# Patient Record
Sex: Male | Born: 1945 | Race: White | Hispanic: No | Marital: Married | Smoking: Former smoker
Health system: Southern US, Community
[De-identification: ages and names within clinical notes are randomized; demographics above are authoritative.]

## PROBLEM LIST (undated history)

## (undated) DIAGNOSIS — K219 Gastro-esophageal reflux disease without esophagitis: Secondary | ICD-10-CM

## (undated) DIAGNOSIS — E119 Type 2 diabetes mellitus without complications: Secondary | ICD-10-CM

## (undated) DIAGNOSIS — J320 Chronic maxillary sinusitis: Secondary | ICD-10-CM

## (undated) DIAGNOSIS — I10 Essential (primary) hypertension: Secondary | ICD-10-CM

## (undated) DIAGNOSIS — E039 Hypothyroidism, unspecified: Secondary | ICD-10-CM

## (undated) HISTORY — PX: JOINT REPLACEMENT: SHX530

---

## 2008-04-09 DIAGNOSIS — C801 Malignant (primary) neoplasm, unspecified: Secondary | ICD-10-CM

## 2008-04-09 HISTORY — PX: LARYNX SURGERY: SHX692

## 2008-04-09 HISTORY — DX: Malignant (primary) neoplasm, unspecified: C80.1

## 2014-12-16 ENCOUNTER — Ambulatory Visit (INDEPENDENT_AMBULATORY_CARE_PROVIDER_SITE_OTHER): Payer: Medicare Other | Admitting: Otolaryngology

## 2014-12-16 DIAGNOSIS — R490 Dysphonia: Secondary | ICD-10-CM | POA: Diagnosis not present

## 2015-06-23 ENCOUNTER — Ambulatory Visit (INDEPENDENT_AMBULATORY_CARE_PROVIDER_SITE_OTHER): Payer: Medicare Other | Admitting: Otolaryngology

## 2015-06-23 DIAGNOSIS — Z8521 Personal history of malignant neoplasm of larynx: Secondary | ICD-10-CM | POA: Diagnosis not present

## 2016-06-21 ENCOUNTER — Ambulatory Visit (INDEPENDENT_AMBULATORY_CARE_PROVIDER_SITE_OTHER): Payer: Medicare Other | Admitting: Otolaryngology

## 2016-06-21 DIAGNOSIS — Z8521 Personal history of malignant neoplasm of larynx: Secondary | ICD-10-CM

## 2016-08-13 ENCOUNTER — Ambulatory Visit (INDEPENDENT_AMBULATORY_CARE_PROVIDER_SITE_OTHER): Payer: Medicare Other | Admitting: Otolaryngology

## 2016-08-13 DIAGNOSIS — H6983 Other specified disorders of Eustachian tube, bilateral: Secondary | ICD-10-CM

## 2016-08-13 DIAGNOSIS — H9313 Tinnitus, bilateral: Secondary | ICD-10-CM

## 2016-08-13 DIAGNOSIS — H903 Sensorineural hearing loss, bilateral: Secondary | ICD-10-CM | POA: Diagnosis not present

## 2017-07-11 ENCOUNTER — Ambulatory Visit (INDEPENDENT_AMBULATORY_CARE_PROVIDER_SITE_OTHER): Payer: Medicare Other | Admitting: Otolaryngology

## 2017-07-11 DIAGNOSIS — Z8521 Personal history of malignant neoplasm of larynx: Secondary | ICD-10-CM

## 2018-04-21 ENCOUNTER — Ambulatory Visit (INDEPENDENT_AMBULATORY_CARE_PROVIDER_SITE_OTHER): Payer: Medicare Other | Admitting: Otolaryngology

## 2018-04-21 DIAGNOSIS — J0101 Acute recurrent maxillary sinusitis: Secondary | ICD-10-CM | POA: Diagnosis not present

## 2018-04-21 DIAGNOSIS — J310 Chronic rhinitis: Secondary | ICD-10-CM

## 2018-05-12 ENCOUNTER — Ambulatory Visit (INDEPENDENT_AMBULATORY_CARE_PROVIDER_SITE_OTHER): Payer: Medicare Other | Admitting: Otolaryngology

## 2018-05-12 DIAGNOSIS — J320 Chronic maxillary sinusitis: Secondary | ICD-10-CM

## 2018-05-12 DIAGNOSIS — J310 Chronic rhinitis: Secondary | ICD-10-CM

## 2018-05-13 ENCOUNTER — Other Ambulatory Visit (HOSPITAL_COMMUNITY): Payer: Self-pay | Admitting: Otolaryngology

## 2018-05-13 ENCOUNTER — Other Ambulatory Visit: Payer: Self-pay | Admitting: Otolaryngology

## 2018-05-13 DIAGNOSIS — J320 Chronic maxillary sinusitis: Secondary | ICD-10-CM

## 2018-05-28 ENCOUNTER — Ambulatory Visit (HOSPITAL_COMMUNITY)
Admission: RE | Admit: 2018-05-28 | Discharge: 2018-05-28 | Disposition: A | Discharge status: Home / Self Care | Payer: Medicare Other | Source: Ambulatory Visit | Attending: Otolaryngology | Admitting: Otolaryngology

## 2018-05-28 DIAGNOSIS — J320 Chronic maxillary sinusitis: Secondary | ICD-10-CM

## 2018-06-09 ENCOUNTER — Ambulatory Visit (INDEPENDENT_AMBULATORY_CARE_PROVIDER_SITE_OTHER): Payer: Medicare Other | Admitting: Otolaryngology

## 2018-06-09 DIAGNOSIS — R51 Headache: Secondary | ICD-10-CM | POA: Diagnosis not present

## 2018-06-09 DIAGNOSIS — J320 Chronic maxillary sinusitis: Secondary | ICD-10-CM | POA: Diagnosis not present

## 2019-03-12 ENCOUNTER — Ambulatory Visit (INDEPENDENT_AMBULATORY_CARE_PROVIDER_SITE_OTHER): Payer: Medicare Other | Admitting: Otolaryngology

## 2019-03-31 ENCOUNTER — Other Ambulatory Visit: Payer: Self-pay | Admitting: Otolaryngology

## 2019-03-31 ENCOUNTER — Other Ambulatory Visit (HOSPITAL_COMMUNITY): Payer: Self-pay | Admitting: Otolaryngology

## 2019-03-31 DIAGNOSIS — J320 Chronic maxillary sinusitis: Secondary | ICD-10-CM

## 2019-04-08 ENCOUNTER — Ambulatory Visit (HOSPITAL_COMMUNITY)
Admission: RE | Admit: 2019-04-08 | Discharge: 2019-04-08 | Disposition: A | Discharge status: Home / Self Care | Payer: Medicare Other | Source: Ambulatory Visit | Attending: Otolaryngology | Admitting: Otolaryngology

## 2019-04-08 ENCOUNTER — Other Ambulatory Visit: Payer: Self-pay

## 2019-04-08 DIAGNOSIS — J320 Chronic maxillary sinusitis: Secondary | ICD-10-CM | POA: Insufficient documentation

## 2019-05-01 ENCOUNTER — Other Ambulatory Visit: Payer: Self-pay | Admitting: Otolaryngology

## 2019-05-08 ENCOUNTER — Other Ambulatory Visit: Payer: Self-pay

## 2019-05-08 ENCOUNTER — Encounter (HOSPITAL_BASED_OUTPATIENT_CLINIC_OR_DEPARTMENT_OTHER): Payer: Self-pay | Admitting: Otolaryngology

## 2019-05-12 ENCOUNTER — Other Ambulatory Visit (HOSPITAL_COMMUNITY)
Admission: RE | Admit: 2019-05-12 | Discharge: 2019-05-12 | Disposition: A | Discharge status: Home / Self Care | Payer: Medicare Other | Source: Ambulatory Visit | Attending: Otolaryngology | Admitting: Otolaryngology

## 2019-05-12 ENCOUNTER — Other Ambulatory Visit: Payer: Self-pay

## 2019-05-12 ENCOUNTER — Encounter (HOSPITAL_BASED_OUTPATIENT_CLINIC_OR_DEPARTMENT_OTHER)
Admission: RE | Admit: 2019-05-12 | Discharge: 2019-05-12 | Disposition: A | Discharge status: Home / Self Care | Payer: Medicare Other | Source: Ambulatory Visit | Attending: Otolaryngology | Admitting: Otolaryngology

## 2019-05-12 DIAGNOSIS — Z20822 Contact with and (suspected) exposure to covid-19: Secondary | ICD-10-CM | POA: Diagnosis not present

## 2019-05-12 DIAGNOSIS — Z01812 Encounter for preprocedural laboratory examination: Secondary | ICD-10-CM | POA: Diagnosis present

## 2019-05-12 LAB — BASIC METABOLIC PANEL
Anion gap: 11 (ref 5–15)
BUN: 12 mg/dL (ref 8–23)
CO2: 25 mmol/L (ref 22–32)
Calcium: 9.5 mg/dL (ref 8.9–10.3)
Chloride: 97 mmol/L — ABNORMAL LOW (ref 98–111)
Creatinine, Ser: 1.15 mg/dL (ref 0.61–1.24)
GFR calc Af Amer: >60 mL/min (ref >60)
GFR calc non Af Amer: >60 mL/min (ref >60)
Glucose, Bld: 250 mg/dL — ABNORMAL HIGH (ref 70–99)
Potassium: 4.7 mmol/L (ref 3.5–5.1)
Sodium: 133 mmol/L — ABNORMAL LOW (ref 135–145)

## 2019-05-12 LAB — SARS CORONAVIRUS 2 (TAT 6-24 HRS): SARS Coronavirus 2: NEGATIVE

## 2019-05-15 ENCOUNTER — Other Ambulatory Visit: Payer: Self-pay

## 2019-05-15 ENCOUNTER — Encounter (HOSPITAL_BASED_OUTPATIENT_CLINIC_OR_DEPARTMENT_OTHER): Payer: Self-pay | Admitting: Otolaryngology

## 2019-05-15 ENCOUNTER — Ambulatory Visit (HOSPITAL_BASED_OUTPATIENT_CLINIC_OR_DEPARTMENT_OTHER)
Admission: RE | Admit: 2019-05-15 | Discharge: 2019-05-15 | Disposition: A | Discharge status: Home / Self Care | Payer: Medicare Other | Attending: Otolaryngology | Admitting: Otolaryngology

## 2019-05-15 ENCOUNTER — Ambulatory Visit (HOSPITAL_BASED_OUTPATIENT_CLINIC_OR_DEPARTMENT_OTHER): Payer: Medicare Other | Admitting: Anesthesiology

## 2019-05-15 ENCOUNTER — Encounter (HOSPITAL_BASED_OUTPATIENT_CLINIC_OR_DEPARTMENT_OTHER)
Admission: RE | Disposition: A | Discharge status: Home / Self Care | Payer: Self-pay | Source: Home / Self Care | Attending: Otolaryngology

## 2019-05-15 DIAGNOSIS — K219 Gastro-esophageal reflux disease without esophagitis: Secondary | ICD-10-CM | POA: Insufficient documentation

## 2019-05-15 DIAGNOSIS — Z87891 Personal history of nicotine dependence: Secondary | ICD-10-CM | POA: Diagnosis not present

## 2019-05-15 DIAGNOSIS — E039 Hypothyroidism, unspecified: Secondary | ICD-10-CM | POA: Diagnosis not present

## 2019-05-15 DIAGNOSIS — Z79899 Other long term (current) drug therapy: Secondary | ICD-10-CM | POA: Insufficient documentation

## 2019-05-15 DIAGNOSIS — I10 Essential (primary) hypertension: Secondary | ICD-10-CM | POA: Diagnosis not present

## 2019-05-15 DIAGNOSIS — E119 Type 2 diabetes mellitus without complications: Secondary | ICD-10-CM | POA: Diagnosis not present

## 2019-05-15 DIAGNOSIS — J320 Chronic maxillary sinusitis: Secondary | ICD-10-CM | POA: Insufficient documentation

## 2019-05-15 DIAGNOSIS — Z7984 Long term (current) use of oral hypoglycemic drugs: Secondary | ICD-10-CM | POA: Diagnosis not present

## 2019-05-15 DIAGNOSIS — Z79890 Hormone replacement therapy (postmenopausal): Secondary | ICD-10-CM | POA: Diagnosis not present

## 2019-05-15 HISTORY — DX: Essential (primary) hypertension: I10

## 2019-05-15 HISTORY — DX: Chronic maxillary sinusitis: J32.0

## 2019-05-15 HISTORY — DX: Gastro-esophageal reflux disease without esophagitis: K21.9

## 2019-05-15 HISTORY — DX: Hypothyroidism, unspecified: E03.9

## 2019-05-15 HISTORY — PX: CLOSURE ORAL ANTRAL FISTULA: SHX6286

## 2019-05-15 HISTORY — DX: Type 2 diabetes mellitus without complications: E11.9

## 2019-05-15 HISTORY — PX: MAXILLARY ANTROSTOMY: SHX2003

## 2019-05-15 LAB — GLUCOSE, CAPILLARY
Glucose-Capillary: 131 mg/dL — ABNORMAL HIGH (ref 70–99)
Glucose-Capillary: 123 mg/dL — ABNORMAL HIGH (ref 70–99)

## 2019-05-15 SURGERY — MAXILLARY ANTROSTOMY
Anesthesia: General | Site: Nose | Laterality: Right

## 2019-05-15 MED ORDER — ONDANSETRON HCL 4 MG/2ML IJ SOLN
INTRAMUSCULAR | Status: AC
  Filled 2019-05-15: qty 2

## 2019-05-15 MED ORDER — OXYMETAZOLINE HCL 0.05 % NA SOLN
NASAL | Status: AC
  Filled 2019-05-15: qty 30

## 2019-05-15 MED ORDER — ACETAMINOPHEN 500 MG PO TABS
1000.0000 mg | ORAL_TABLET | Freq: Once | ORAL | Status: AC
  Administered 2019-05-15: 1000 mg via ORAL

## 2019-05-15 MED ORDER — PROMETHAZINE HCL 25 MG/ML IJ SOLN: 6.2500 mg | INTRAMUSCULAR | Status: DC | PRN

## 2019-05-15 MED ORDER — LIDOCAINE 2% (20 MG/ML) 5 ML SYRINGE
INTRAMUSCULAR | Status: DC | PRN
  Administered 2019-05-15: 60 mg via INTRAVENOUS

## 2019-05-15 MED ORDER — LACTATED RINGERS IV SOLN: INTRAVENOUS | Status: DC

## 2019-05-15 MED ORDER — OXYCODONE HCL 5 MG PO TABS: 5.0000 mg | ORAL_TABLET | Freq: Once | ORAL | Status: DC | PRN

## 2019-05-15 MED ORDER — ROCURONIUM BROMIDE 10 MG/ML (PF) SYRINGE
PREFILLED_SYRINGE | INTRAVENOUS | Status: AC
  Filled 2019-05-15: qty 10

## 2019-05-15 MED ORDER — LIDOCAINE 2% (20 MG/ML) 5 ML SYRINGE
INTRAMUSCULAR | Status: AC
  Filled 2019-05-15: qty 5

## 2019-05-15 MED ORDER — OXYMETAZOLINE HCL 0.05 % NA SOLN
NASAL | Status: DC | PRN
  Administered 2019-05-15: 1 via TOPICAL

## 2019-05-15 MED ORDER — ACETAMINOPHEN 500 MG PO TABS
ORAL_TABLET | ORAL | Status: AC
  Filled 2019-05-15: qty 2

## 2019-05-15 MED ORDER — DEXAMETHASONE SODIUM PHOSPHATE 10 MG/ML IJ SOLN
INTRAMUSCULAR | Status: AC
  Filled 2019-05-15: qty 1

## 2019-05-15 MED ORDER — ROCURONIUM BROMIDE 100 MG/10ML IV SOLN
INTRAVENOUS | Status: DC | PRN
  Administered 2019-05-15: 50 mg via INTRAVENOUS

## 2019-05-15 MED ORDER — ONDANSETRON HCL 4 MG/2ML IJ SOLN
INTRAMUSCULAR | Status: DC | PRN
  Administered 2019-05-15: 4 mg via INTRAVENOUS

## 2019-05-15 MED ORDER — MUPIROCIN 2 % EX OINT
TOPICAL_OINTMENT | CUTANEOUS | Status: DC | PRN
  Administered 2019-05-15: 1 via NASAL

## 2019-05-15 MED ORDER — DEXAMETHASONE SODIUM PHOSPHATE 4 MG/ML IJ SOLN
INTRAMUSCULAR | Status: DC | PRN
  Administered 2019-05-15: 5 mg via INTRAVENOUS

## 2019-05-15 MED ORDER — CEFAZOLIN SODIUM-DEXTROSE 2-3 GM-%(50ML) IV SOLR
INTRAVENOUS | Status: DC | PRN
  Administered 2019-05-15: 2 g via INTRAVENOUS

## 2019-05-15 MED ORDER — OXYCODONE-ACETAMINOPHEN 5-325 MG PO TABS: 1.0000 | ORAL_TABLET | ORAL | 0 refills | Status: AC | PRN

## 2019-05-15 MED ORDER — LIDOCAINE-EPINEPHRINE 1 %-1:100000 IJ SOLN
INTRAMUSCULAR | Status: DC | PRN
  Administered 2019-05-15: 3 mL

## 2019-05-15 MED ORDER — FENTANYL CITRATE (PF) 100 MCG/2ML IJ SOLN
INTRAMUSCULAR | Status: AC
  Filled 2019-05-15: qty 2

## 2019-05-15 MED ORDER — EPHEDRINE 5 MG/ML INJ
INTRAVENOUS | Status: AC
  Filled 2019-05-15: qty 10

## 2019-05-15 MED ORDER — PROPOFOL 10 MG/ML IV BOLUS
INTRAVENOUS | Status: DC | PRN
  Administered 2019-05-15: 2 mg via INTRAVENOUS
  Administered 2019-05-15: 50 mg via INTRAVENOUS

## 2019-05-15 MED ORDER — EPHEDRINE SULFATE-NACL 50-0.9 MG/10ML-% IV SOSY
PREFILLED_SYRINGE | INTRAVENOUS | Status: DC | PRN
  Administered 2019-05-15: 5 mg via INTRAVENOUS
  Administered 2019-05-15: 15 mg via INTRAVENOUS
  Administered 2019-05-15: 5 mg via INTRAVENOUS

## 2019-05-15 MED ORDER — MIDAZOLAM HCL 2 MG/2ML IJ SOLN: 1.0000 mg | INTRAMUSCULAR | Status: DC | PRN

## 2019-05-15 MED ORDER — OXYCODONE HCL 5 MG/5ML PO SOLN: 5.0000 mg | Freq: Once | ORAL | Status: DC | PRN

## 2019-05-15 MED ORDER — AMOXICILLIN-POT CLAVULANATE 875-125 MG PO TABS: 1.0000 | ORAL_TABLET | Freq: Two times a day (BID) | ORAL | 0 refills | Status: AC

## 2019-05-15 MED ORDER — SUGAMMADEX SODIUM 200 MG/2ML IV SOLN
INTRAVENOUS | Status: DC | PRN
  Administered 2019-05-15: 200 mg via INTRAVENOUS

## 2019-05-15 MED ORDER — FENTANYL CITRATE (PF) 100 MCG/2ML IJ SOLN
50.0000 ug | INTRAMUSCULAR | Status: DC | PRN
  Administered 2019-05-15: 11:00:00 100 ug via INTRAVENOUS

## 2019-05-15 MED ORDER — FENTANYL CITRATE (PF) 100 MCG/2ML IJ SOLN: 25.0000 ug | INTRAMUSCULAR | Status: DC | PRN

## 2019-05-15 SURGICAL SUPPLY — 56 items
BLADE RAD40 ROTATE 4M 4 5PK (BLADE) IMPLANT
BLADE RAD40 ROTATE 4M 4MM 5PK (BLADE)
BLADE RAD60 ROTATE M4 4 5PK (BLADE) IMPLANT
BLADE RAD60 ROTATE M4 4MM 5PK (BLADE)
BLADE SURG 15 STRL LF DISP TIS (BLADE) IMPLANT
BLADE SURG 15 STRL SS (BLADE)
BLADE TRICUT ROTATE M4 4 5PK (BLADE) ×4 IMPLANT
BLADE TRICUT ROTATE M4 4MM 5PK (BLADE) ×1
CANISTER SUC SOCK COL 7IN (MISCELLANEOUS) ×5 IMPLANT
CANISTER SUCT 1200ML W/VALVE (MISCELLANEOUS) ×5 IMPLANT
COAGULATOR SUCT 8FR VV (MISCELLANEOUS) IMPLANT
COVER MAYO STAND STRL (DRAPES) ×5 IMPLANT
COVER WAND RF STERILE (DRAPES) IMPLANT
DECANTER SPIKE VIAL GLASS SM (MISCELLANEOUS) ×5 IMPLANT
DRSG NASOPORE 8CM (GAUZE/BANDAGES/DRESSINGS) ×5 IMPLANT
ELECT COATED BLADE 2.86 ST (ELECTRODE) ×5 IMPLANT
ELECT NEEDLE BLADE 2-5/6 (NEEDLE) IMPLANT
ELECT REM PT RETURN 9FT ADLT (ELECTROSURGICAL) ×5
ELECT REM PT RETURN 9FT PED (ELECTROSURGICAL)
ELECTRODE REM PT RETRN 9FT PED (ELECTROSURGICAL) IMPLANT
ELECTRODE REM PT RTRN 9FT ADLT (ELECTROSURGICAL) ×3 IMPLANT
GLOVE BIO SURGEON STRL SZ7.5 (GLOVE) ×10 IMPLANT
GLOVE BIOGEL PI IND STRL 7.0 (GLOVE) ×6 IMPLANT
GLOVE BIOGEL PI INDICATOR 7.0 (GLOVE) ×4
GLOVE ECLIPSE 6.5 STRL STRAW (GLOVE) ×10 IMPLANT
GLOVE SURG SS PI 7.0 STRL IVOR (GLOVE) ×10 IMPLANT
GOWN STRL REUS W/ TWL LRG LVL3 (GOWN DISPOSABLE) ×9 IMPLANT
GOWN STRL REUS W/TWL LRG LVL3 (GOWN DISPOSABLE) ×6
HEMOSTAT SURGICEL 2X14 (HEMOSTASIS) IMPLANT
IV NS 500ML (IV SOLUTION) ×2
IV NS 500ML BAXH (IV SOLUTION) ×3 IMPLANT
NEEDLE HYPO 25X1 1.5 SAFETY (NEEDLE) ×5 IMPLANT
NEEDLE PRECISIONGLIDE 27X1.5 (NEEDLE) IMPLANT
NS IRRIG 1000ML POUR BTL (IV SOLUTION) ×5 IMPLANT
PACK BASIN DAY SURGERY FS (CUSTOM PROCEDURE TRAY) ×5 IMPLANT
PACK ENT DAY SURGERY (CUSTOM PROCEDURE TRAY) ×5 IMPLANT
PENCIL FOOT CONTROL (ELECTRODE) IMPLANT
PENCIL SMOKE EVACUATOR (MISCELLANEOUS) ×5 IMPLANT
SHEET MEDIUM DRAPE 40X70 STRL (DRAPES) IMPLANT
SLEEVE SCD COMPRESS KNEE MED (MISCELLANEOUS) ×5 IMPLANT
SOLUTION BUTLER CLEAR DIP (MISCELLANEOUS) ×5 IMPLANT
SPONGE GAUZE 2X2 8PLY STER LF (GAUZE/BANDAGES/DRESSINGS) ×1
SPONGE GAUZE 2X2 8PLY STRL LF (GAUZE/BANDAGES/DRESSINGS) ×4 IMPLANT
SPONGE NEURO XRAY DETECT 1X3 (DISPOSABLE) ×5 IMPLANT
SPONGE TONSIL TAPE 1.25 RFD (DISPOSABLE) IMPLANT
SUCTION FRAZIER HANDLE 10FR (MISCELLANEOUS)
SUCTION TUBE FRAZIER 10FR DISP (MISCELLANEOUS) IMPLANT
SUT CHROMIC 5 0 P 3 (SUTURE) IMPLANT
SUT VIC AB 3-0 FS2 27 (SUTURE) ×5 IMPLANT
SYR 50ML LL SCALE MARK (SYRINGE) ×5 IMPLANT
SYR CONTROL 10ML LL (SYRINGE) IMPLANT
TOWEL GREEN STERILE FF (TOWEL DISPOSABLE) ×5 IMPLANT
TUBE CONNECTING 20'X1/4 (TUBING) ×1
TUBE CONNECTING 20X1/4 (TUBING) ×4 IMPLANT
TUBE SALEM SUMP 16 FR W/ARV (TUBING) ×5 IMPLANT
YANKAUER SUCT BULB TIP NO VENT (SUCTIONS) IMPLANT

## 2019-05-15 NOTE — Anesthesia Preprocedure Evaluation (Addendum)
Anesthesia Evaluation  Patient identified by MRN, date of birth, ID band Patient awake    Reviewed: Allergy & Precautions, NPO status , Patient's Chart, lab work & pertinent test results, reviewed documented beta blocker date and time   History of Anesthesia Complications Negative for: history of anesthetic complications  Airway Mallampati: II  TM Distance: >3 FB Neck ROM: Full    Dental  (+) Missing,    Pulmonary neg pulmonary ROS, former smoker,    Pulmonary exam normal        Cardiovascular hypertension, Pt. on home beta blockers and Pt. on medications Normal cardiovascular exam     Neuro/Psych negative neurological ROS  negative psych ROS   GI/Hepatic Neg liver ROS, GERD  Controlled,  Endo/Other  diabetes, Type 2, Oral Hypoglycemic AgentsHypothyroidism   Renal/GU negative Renal ROS  negative genitourinary   Musculoskeletal negative musculoskeletal ROS (+)   Abdominal   Peds  Hematology negative hematology ROS (+)   Anesthesia Other Findings Hx of larynx surgery and radiation  Reproductive/Obstetrics negative OB ROS                           Anesthesia Physical Anesthesia Plan  ASA: II  Anesthesia Plan: General   Post-op Pain Management:    Induction: Intravenous  PONV Risk Score and Plan: 2 and Treatment may vary due to age or medical condition, Ondansetron, Dexamethasone and Midazolam  Airway Management Planned: Oral ETT and Video Laryngoscope Planned  Additional Equipment: None  Intra-op Plan:   Post-operative Plan: Extubation in OR  Informed Consent: I have reviewed the patients History and Physical, chart, labs and discussed the procedure including the risks, benefits and alternatives for the proposed anesthesia with the patient or authorized representative who has indicated his/her understanding and acceptance.     Dental advisory given  Plan Discussed with:  CRNA  Anesthesia Plan Comments:        Anesthesia Quick Evaluation

## 2019-05-15 NOTE — Discharge Instructions (Addendum)

## 2019-05-15 NOTE — Transfer of Care (Signed)
Immediate Anesthesia Transfer of Care Note  Patient: Rick Rodgers  Procedure(s) Performed: MAXILLARY ANTROSTOMY WITH TISSUE REMOVAL (Right Nose) ORAL FISUTLA REPAIR (Right Mouth)  Patient Location: PACU  Anesthesia Type:General  Level of Consciousness: awake and alert   Airway & Oxygen Therapy: Patient Spontanous Breathing and Patient connected to face mask oxygen  Post-op Assessment: Report given to RN and Post -op Vital signs reviewed and stable  Post vital signs: Reviewed and stable  Last Vitals:  Vitals Value Taken Time  BP    Temp    Pulse 59 05/15/19 1147  Resp 16 05/15/19 1147  SpO2 100 % 05/15/19 1147  Vitals shown include unvalidated device data.  Last Pain:  Vitals:   05/15/19 0916  TempSrc: Tympanic  PainSc: 0-No pain         Complications: No apparent anesthesia complications

## 2019-05-15 NOTE — Op Note (Signed)
DATE OF PROCEDURE: 05/15/2019  OPERATIVE REPORT   SURGEON: Leta Baptist, MD   PREOPERATIVE DIAGNOSES:  1.  Right oromaxillary fistula. 2.  Right chronic maxillary sinusitis and polyposis.  POSTOPERATIVE DIAGNOSES:  1.  Right oromaxillary fistula. 2.  Right chronic maxillary sinusitis and polyposis.  PROCEDURE PERFORMED:  1. Repair of right oral maxillary fistula. 2. Endoscopic right maxillary antrostomy with polyp removal.  ANESTHESIA: General endotracheal tube anesthesia.   COMPLICATIONS: None.   ESTIMATED BLOOD LOSS: 50 mL.   INDICATION FOR PROCEDURE: Rick Rodgers is a 74 y.o. male with a history of chronic right maxillary sinusitis.  He was treated with multiple courses of antibiotics.  His infection was a result of a dental abscess.  He underwent extraction of the infected right second premolar tooth.  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  Over the past 2 months, he continued to have purulent drainage from his oral maxillary fistula as well as his maxillary antrum.  He was treated with multiple courses of antibiotics without improvement in his symptoms. Based on the above findings, the decision was made for the patient to undergo the above-stated procedures. The risks, benefits, alternatives, and details of the procedures were discussed with the patient. Questions were invited and answered. Informed consent was obtained.   DESCRIPTION OF PROCEDURE: The patient was taken to the operating room and placed supine on the operating table. General endotracheal tube anesthesia was administered by the anesthesiologist. The patient was positioned, and prepped and draped in the standard fashion for nasal surgery. Pledgets soaked with Afrin were placed in both nasal cavities for decongestion. The pledgets were subsequently removed.   Using a 0 endoscope, the right nasal cavity was examined.  The middle turbinate was medialized.  Polypoid tissue was noted within the middle meatus. The  polypoid tissue was removed using a combination of microdebrider and Blakesley forceps. The uncinate process was resected with a freer elevator. The maxillary antrum was entered and enlarged using a combination of backbiter and microdebrider. Polypoid tissue was removed from the right maxillary sinus.  Purulent drainage was also suctioned from the right maxillary sinus.  The right maxillary sinus was copiously irrigated.   Attention was then focused on the right oral maxillary fistula.  The oral cavity was exposed.  The purulent drainage was noted from the oral maxillary fistula, located at the location of the previous right second premolar.  The fistula was probed with a lacrimal probe.  A rim of fibrotic mucosa was excised from the fistula.  Relaxing incision was made lateral to the oral maxillary fistula.  Careful undermining was performed to free the gingival mucosa.  The oral maxillary fistula was closed with interrupted 3-0 Vicryl sutures.  The care of the patient was turned over to the anesthesiologist. The patient was awakened from anesthesia without difficulty. The patient was extubated and transferred to the recovery room in good condition.   OPERATIVE FINDINGS: Chronic right maxillary sinusitis and polyposis.  Right oral maxillary fistula.  SPECIMEN: Right sinus contents.  FOLLOWUP CARE: The patient be discharged home once he is awake and alert. The patient will be placed on Percocet 1 tablets p.o. q.4 hours p.r.n. pain, and Augmentin 875 mg p.o. b.i.d. for 5 days. The patient will follow up in my office in 1 week.  Tyisha Cressy Raynelle Bring, MD

## 2019-05-15 NOTE — Anesthesia Postprocedure Evaluation (Signed)
Anesthesia Post Note  Patient: Rick Rodgers  Procedure(s) Performed: MAXILLARY ANTROSTOMY WITH TISSUE REMOVAL (Right Nose) ORAL FISUTLA REPAIR (Right Mouth)     Patient location during evaluation: PACU Anesthesia Type: General Level of consciousness: awake and alert and oriented Pain management: pain level controlled Vital Signs Assessment: post-procedure vital signs reviewed and stable Respiratory status: spontaneous breathing, nonlabored ventilation and respiratory function stable Cardiovascular status: blood pressure returned to baseline Postop Assessment: no apparent nausea or vomiting Anesthetic complications: no    Last Vitals:  Vitals:   05/15/19 1145 05/15/19 1200  BP: (!) 176/74 (!) 173/78  Pulse: 60 (!) 56  Resp: 15 12  Temp: 36.4 C 36.6 C  SpO2: 100% 96%    Last Pain:  Vitals:   05/15/19 1200  TempSrc:   PainSc: 0-No pain                 Brennan Bailey

## 2019-05-15 NOTE — H&P (Signed)
Cc: Chronic right maxillary sinusitis, oroantral fistula  HPI: The patient is a 74 year old male who returns today complaining of persistent sinus infection.  The patient has a history of chronic right maxillary sinusitis.  He was previously treated with multiple courses of antibiotics.  The infection was felt to be a result of his dental abscess.  He underwent extraction of his infected tooth.  However, he continued to be symptomatic.  He underwent a repeat CT scan which showed a right oroantral fistula.  His right maxillary sinus was completely opacified.  The patient returns today complaining of persistent right-sided facial pain and pressure, purulent nasal drainage, and purulent drainage through the oroantral fistula into his mouth.  He is interested in surgical intervention to treat his chronic condition.   Exam: The flexible scope was inserted into the right nasal cavity. Endoscopy of the nasal cavity, superior, inferior and middle meatus was performed. Mucopurulent drainage was noted from the right middle meatus. No polyp, mass, or lesion was appreciated. Olfactory cleft was clear. Nasopharynx was clear. Turbinates were hypertrophied but without mass. The procedure was repeated on the contralateral side. The larynx was normal. The patient tolerated the procedure well.   Assessment: 1.  Chronic right maxillary sinusitis, with complete opacification of his right maxillary cavity.  2.  The patient has a large oroantral fistula.   Plan: 1.  The nasal endoscopy findings are reviewed with the patient.  The patient is informed that he continues to have purulent drainage from his right middle meatus.   2.  The CT images are also reviewed with the patient.  3.  Based on the above findings, the patient will benefit from surgical intervention to close his oroantral fistula and to undergo endoscopic right maxillary sinus surgery to treat his right chronic maxillary sinusitis and possible polyposis.  The  risks, benefits, alternatives and details of the procedure are reviewed with the patient. Questions are invited and answered.  4.  The patient would like to proceed with the procedures.

## 2019-05-15 NOTE — Anesthesia Procedure Notes (Signed)
Procedure Name: Intubation Date/Time: 05/15/2019 10:38 AM Performed by: Lieutenant Diego, CRNA Pre-anesthesia Checklist: Patient identified, Emergency Drugs available, Suction available and Patient being monitored Patient Re-evaluated:Patient Re-evaluated prior to induction Oxygen Delivery Method: Circle system utilized Preoxygenation: Pre-oxygenation with 100% oxygen Induction Type: IV induction Ventilation: Mask ventilation without difficulty Laryngoscope Size: Glidescope Grade View: Grade I Tube type: Oral Tube size: 7.0 mm Number of attempts: 1 Airway Equipment and Method: Stylet,  Oral airway and Video-laryngoscopy Placement Confirmation: ETT inserted through vocal cords under direct vision,  positive ETCO2 and breath sounds checked- equal and bilateral Secured at: 22 cm Tube secured with: Tape Dental Injury: Teeth and Oropharynx as per pre-operative assessment

## 2019-05-18 LAB — SURGICAL PATHOLOGY

## 2019-05-19 ENCOUNTER — Encounter: Payer: Self-pay | Admitting: *Deleted

## 2020-08-25 ENCOUNTER — Encounter: Payer: Self-pay | Admitting: Internal Medicine

## 2020-12-29 ENCOUNTER — Ambulatory Visit: Payer: Medicare Other | Admitting: Gastroenterology

## 2021-01-05 ENCOUNTER — Ambulatory Visit: Payer: Medicare Other | Admitting: Gastroenterology

## 2021-11-30 IMAGING — CT CT MAXILLOFACIAL W/O CM
3 series · 14 of 47 positions shown, 16 images · non-contrast
Comparison: CT sinus 05/28/2018

CLINICAL DATA: Chronic sinusitis

EXAM:
CT MAXILLOFACIAL WITHOUT CONTRAST
TECHNIQUE: Multidetector CT images of the paranasal sinuses were obtained using
the standard protocol without intravenous contrast.

[Series 2: standard · axial · 0.38mm/px · z∈[+1286,+1380]mm · 8 of 111 slices shown, 10 images]
[im 8/111  brain]
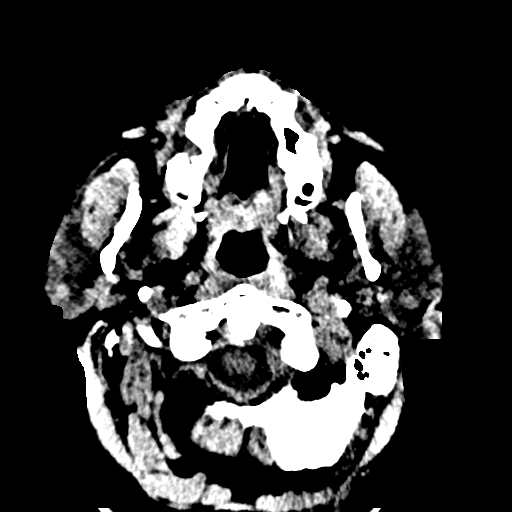
[im 8/111  bone]
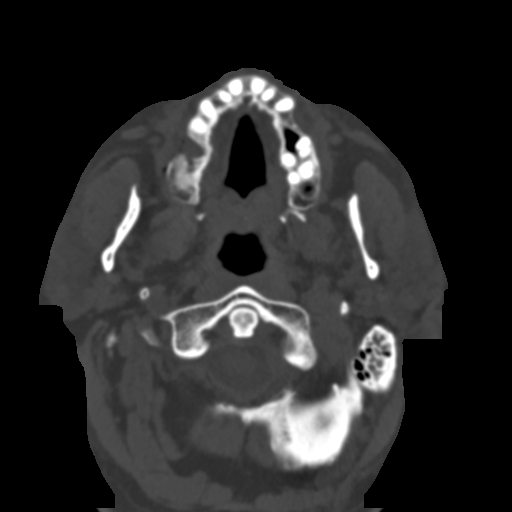
[im 23/111  bone]
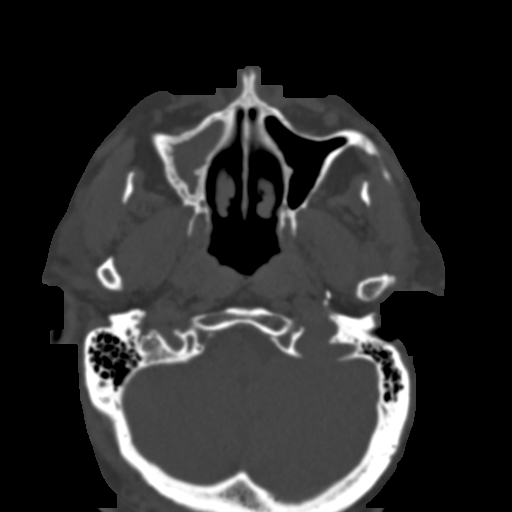
[im 35/111  bone]
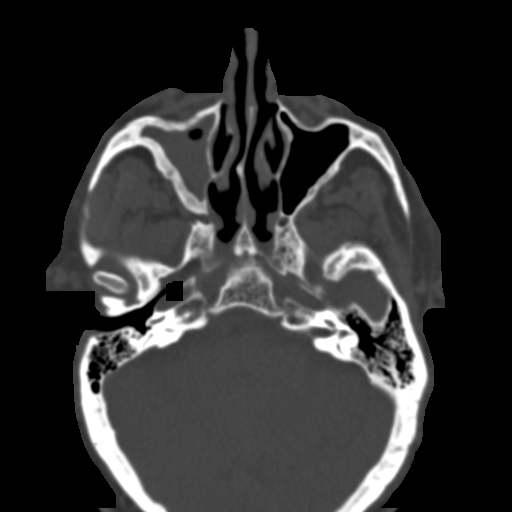
[im 50/111  bone]
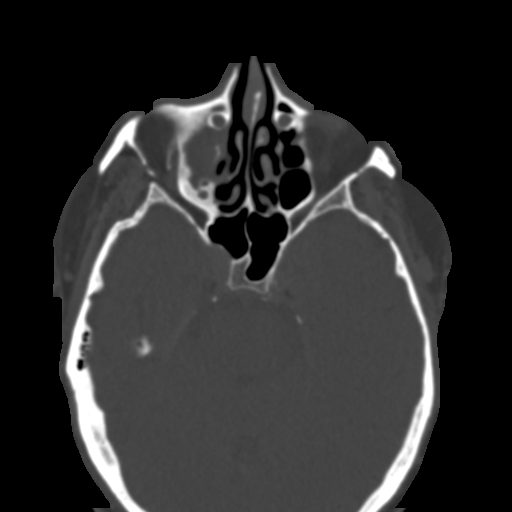
[im 61/111  brain]
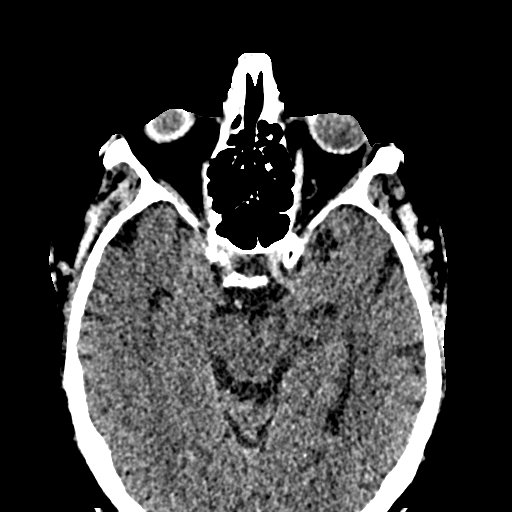
[im 61/111  bone]
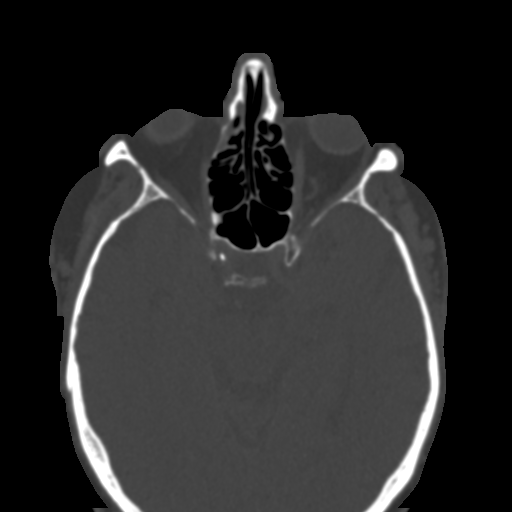
[im 76/111  bone]
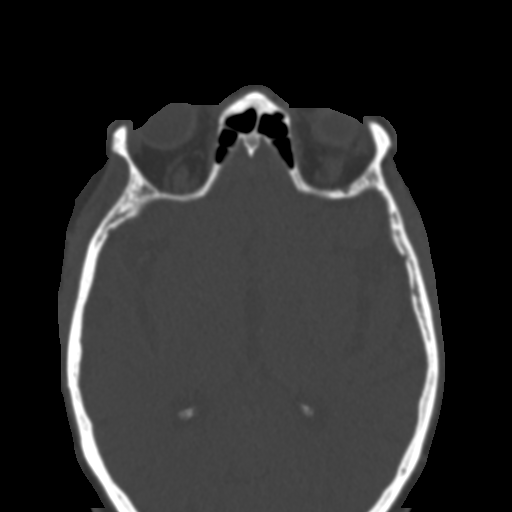
[im 88/111  bone]
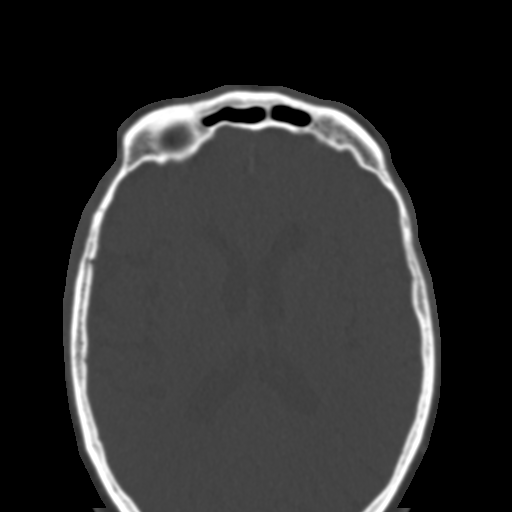
[im 103/111  bone]
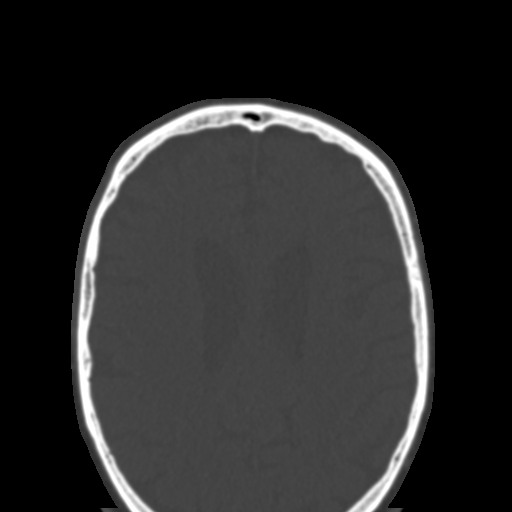

[Series 4: coronal · coronal · 0.22mm/px · 3 of 72 slices shown]
[im 24/72  bone]
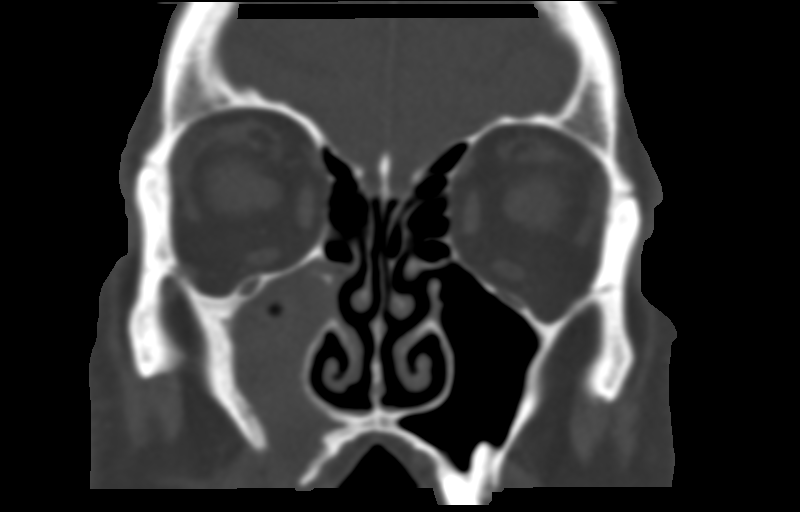
[im 32/72  bone]
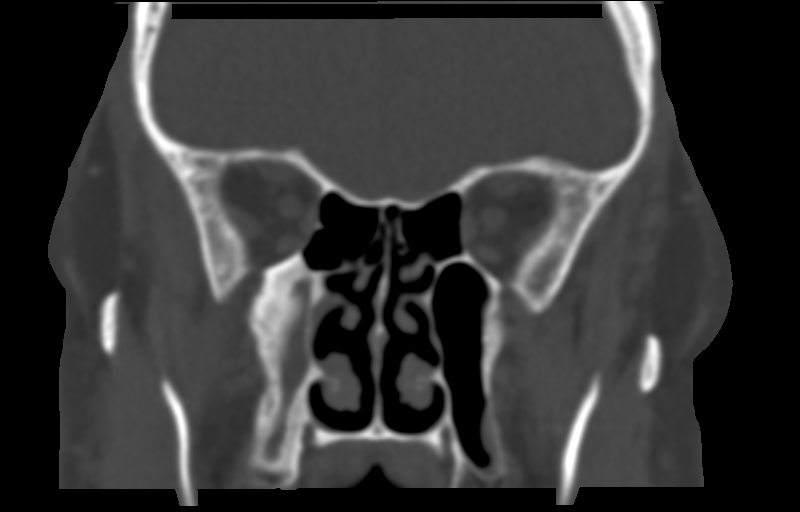
[im 40/72  bone]
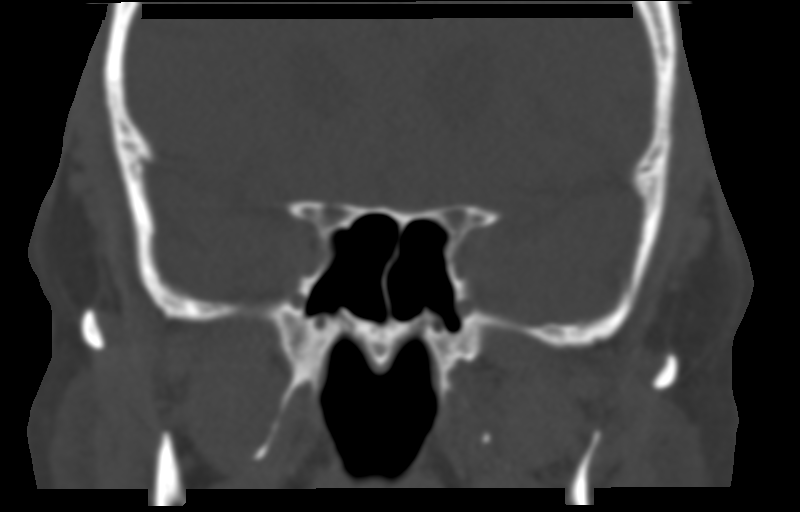

[Series 5: sagittal · sagittal · 0.24mm/px · 3 of 88 slices shown]
[im 30/88  bone]
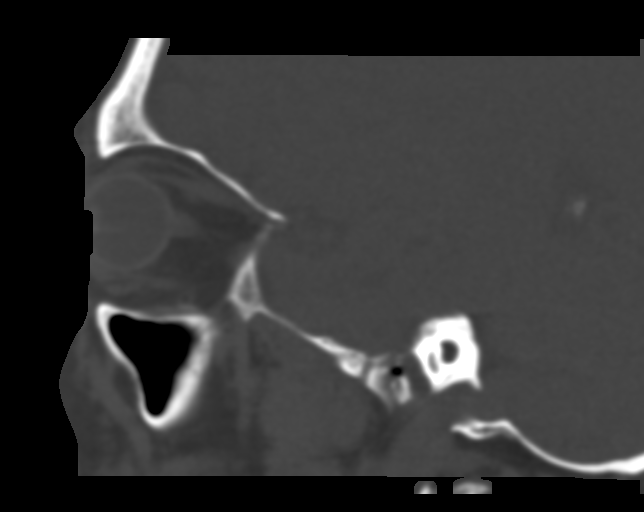
[im 44/88  bone]
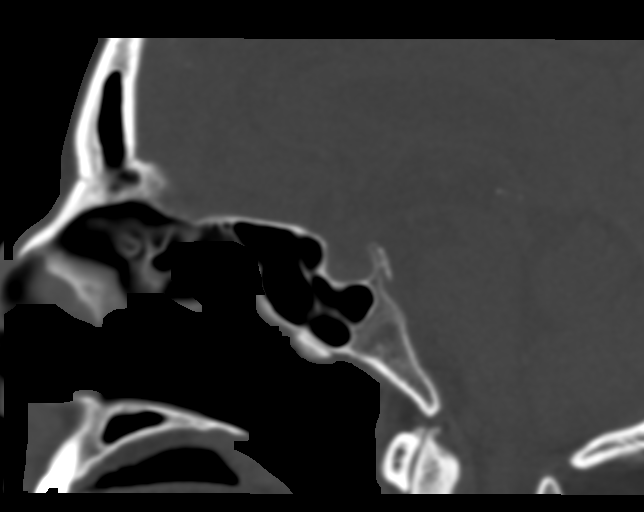
[im 59/88  bone]
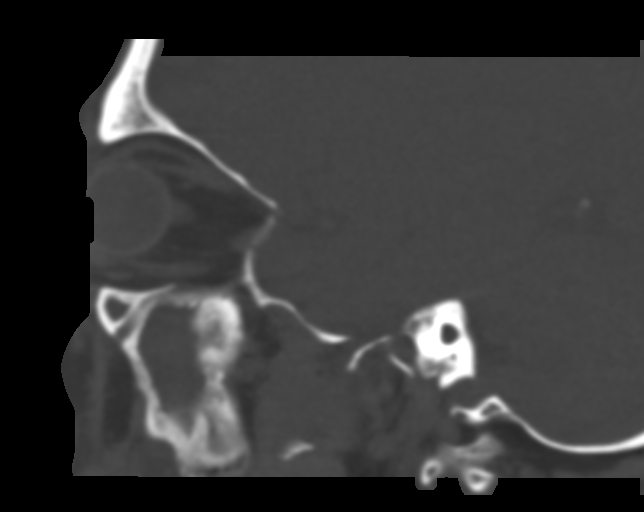

[14 of 47 positions shown; findings below may reference images not displayed]

FINDINGS: Paranasal sinuses:

Frontal: Normally aerated. Patent frontal sinus drainage pathways.

Ethmoid: Mild-to-moderate mucosal edema right ethmoid sinus has
progressed. Left ethmoid sinus clear

Maxillary: Interval extraction right upper pre molar which showed
extensive periapical lucency on the prior study. Oral antral fistula
now present at this level through the hard palate. The fistula is
opacified by soft tissue density. Near complete opacification right
maxillary sinus unchanged with extensive bony thickening, unchanged.

Mild mucosal edema base of left maxillary sinus has improved. Mild
bony thickening left maxillary sinus.

Sphenoid: Normally aerated. Patent sphenoethmoidal recesses.

Mastoid: Clear bilaterally

Right ostiomeatal unit: Occluded

Left ostiomeatal unit: Patent.

Nasal passages: Patent. Intact nasal septum is midline.

Anatomy: Keros II olfactory recess unchanged. Limited intracranial
imaging demonstrates atrophy without acute abnormality.

Negative orbit.
IMPRESSION: Extensive opacification right maxillary sinus with bony thickening
as noted previously. Interval extraction of right upper pre molar
with oral antral fistula at the site of dental extraction. The
fistula is opacified by soft tissue density.

Mild mucosal edema left maxillary sinus with improvement.
Progressive mucosal edema right ethmoid sinus.
